# Patient Record
Sex: Male | Born: 1985 | Race: White | Hispanic: No | Smoking: Never smoker
Health system: Southern US, Community
[De-identification: ages and names within clinical notes are randomized; demographics above are authoritative.]

## PROBLEM LIST (undated history)

## (undated) DIAGNOSIS — K219 Gastro-esophageal reflux disease without esophagitis: Secondary | ICD-10-CM

## (undated) DIAGNOSIS — I10 Essential (primary) hypertension: Secondary | ICD-10-CM

---

## 2013-11-26 ENCOUNTER — Emergency Department (HOSPITAL_COMMUNITY)
Admission: EM | Admit: 2013-11-26 | Discharge: 2013-11-26 | Disposition: A | Discharge status: Home / Self Care | Payer: Medicaid Other | Attending: Emergency Medicine | Admitting: Emergency Medicine

## 2013-11-26 ENCOUNTER — Emergency Department (HOSPITAL_COMMUNITY): Payer: Medicaid Other

## 2013-11-26 ENCOUNTER — Encounter (HOSPITAL_COMMUNITY): Payer: Self-pay | Admitting: Emergency Medicine

## 2013-11-26 DIAGNOSIS — J069 Acute upper respiratory infection, unspecified: Secondary | ICD-10-CM

## 2013-11-26 DIAGNOSIS — R05 Cough: Secondary | ICD-10-CM | POA: Diagnosis present

## 2013-11-26 MED ORDER — HYDROCODONE-ACETAMINOPHEN 7.5-325 MG/15ML PO SOLN: 15.0000 mL | Freq: Every evening | ORAL | Status: AC | PRN

## 2013-11-26 MED ORDER — BENZONATATE 100 MG PO CAPS: 100.0000 mg | ORAL_CAPSULE | Freq: Three times a day (TID) | ORAL | Status: AC

## 2013-11-26 MED ORDER — HYDROCOD POLST-CHLORPHEN POLST 10-8 MG/5ML PO LQCR
5.0000 mL | Freq: Once | ORAL | Status: AC
  Administered 2013-11-26: 5 mL via ORAL
  Filled 2013-11-26: qty 5

## 2013-11-26 MED ORDER — DM-GUAIFENESIN ER 30-600 MG PO TB12: 1.0000 | ORAL_TABLET | Freq: Two times a day (BID) | ORAL | Status: AC

## 2013-11-26 MED ORDER — DEXAMETHASONE 6 MG PO TABS
6.0000 mg | ORAL_TABLET | Freq: Once | ORAL | Status: AC
  Administered 2013-11-26: 6 mg via ORAL
  Filled 2013-11-26: qty 1

## 2013-11-26 NOTE — Discharge Instructions (Signed)
Call for a follow up appointment with a Family or Primary Care Provider.  Return if Symptoms worsen.   Take medication as prescribed.  Drink plenty of fluids. Salt water gargle 3-4 times a day.

## 2013-11-26 NOTE — ED Notes (Signed)
Pt alert and oriented x4. Respirations even and unlabored, bilateral symmetrical rise and fall of chest. Skin warm and dry. In no acute distress. Denies needs.   

## 2013-11-26 NOTE — ED Notes (Signed)
Pt back from x-ray.

## 2013-11-26 NOTE — ED Notes (Signed)
Pt escorted to discharge window. Pt verbalized understanding discharge instructions. In no acute distress.  

## 2013-11-26 NOTE — ED Notes (Addendum)
Pt c/o cough, sore throat, and congestion x 2 days.  Pt reports intermittent central chest pain, only w/ cough.  Sts he did not take anything for symptoms.  Pt presents w/ two family members exhibiting same symptoms.

## 2013-11-26 NOTE — ED Notes (Signed)
Pt to xray

## 2013-11-26 NOTE — ED Provider Notes (Signed)
Medical screening examination/treatment/procedure(s) were performed by non-physician practitioner and as supervising physician I was immediately available for consultation/collaboration.   EKG Interpretation None        Layla MawKristen N Ward, DO 11/26/13 [phone]

## 2013-11-26 NOTE — ED Provider Notes (Signed)
CSN: [id number]     Arrival date & time 11/26/13  [id number] History   First MD Initiated Contact with Patient 11/26/13 0740     Chief Complaint  Patient presents with  . Cough  . Sore Throat     (Consider location/radiation/quality/duration/timing/severity/associated sxs/prior Treatment) HPI Comments: Patient is a 28 year old male presents emergency room chief complaint of cough for 2 days. The patient reports initial onset of sore throat, self resolved. He reports a productive cough. Temperature 98 to 99. No treatment prior to arrival. Reports multiple family members with similar symptoms. No PCP  Patient is a 28 y.o. male presenting with cough and pharyngitis. The history is provided by the patient. No language interpreter was used.  Cough Associated symptoms: sore throat   Associated symptoms: no chills and no fever   Sore Throat Associated symptoms include coughing and a sore throat. Pertinent negatives include no chills or fever.    History reviewed. No pertinent past medical history. History reviewed. No pertinent past surgical history. History reviewed. No pertinent family history. History  Substance Use Topics  . Smoking status: Never Smoker   . Smokeless tobacco: Not on file  . Alcohol Use: No    Review of Systems  Constitutional: Negative for fever and chills.  HENT: Positive for sore throat.   Respiratory: Positive for cough.       Allergies  Review of patient's allergies indicates not on file.  Home Medications   Prior to Admission medications   Not on File   BP 153/76  Pulse 92  Temp(Src) 98.4 F (36.9 C) (Oral)  Resp 16  SpO2 98% Physical Exam  Nursing note and vitals reviewed. Constitutional: He is oriented to person, place, and time. He appears well-developed and well-nourished.  Non-toxic appearance. He does not have a sickly appearance. He does not appear ill. No distress.  HENT:  Head: Normocephalic and atraumatic.  Mouth/Throat: Uvula is  midline. Mucous membranes are not dry. No trismus in the jaw. Posterior oropharyngeal erythema present. No oropharyngeal exudate or tonsillar abscesses.  Eyes: EOM are normal. Pupils are equal, round, and reactive to light.  Neck: Neck supple.  Pulmonary/Chest: Effort normal. He has no wheezes. He has no rales. He exhibits no tenderness.  Lymphadenopathy:       Head (right side): No tonsillar and no occipital adenopathy present.       Head (left side): No tonsillar and no occipital adenopathy present.       Right cervical: No superficial cervical adenopathy present.      Left cervical: No superficial cervical adenopathy present.  Neurological: He is alert and oriented to person, place, and time.  Skin: Skin is warm and dry. He is not diaphoretic.  Psychiatric: He has a normal mood and affect. His behavior is normal.    ED Course  Procedures (including critical care time) Labs Review Labs Reviewed - No data to display  Imaging Review Dg Chest 2 View  11/26/2013   CLINICAL DATA:  Sore throat and cough.  Nonsmoker.  EXAM: CHEST  2 VIEW  COMPARISON:  None.  FINDINGS: The heart size and mediastinal contours are within normal limits. Both lungs are clear. The visualized skeletal structures are unremarkable.  IMPRESSION: No active cardiopulmonary disease.   Electronically Signed   By: Richarda OverlieAdam  Henn M.D.   On: 11/26/2013 08:37     EKG Interpretation None      MDM   Final diagnoses:  URI (upper respiratory infection)   Pt CXR negative  for acute infiltrate. Patients symptoms are consistent with URI, likely viral etiology. Discussed that antibiotics are not indicated for viral infections. Pt will be discharged with symptomatic treatment.  Verbalizes understanding and is agreeable with plan. Pt is hemodynamically stable & in NAD prior to dc.  Meds given in ED:  Medications  chlorpheniramine-HYDROcodone (TUSSIONEX) 10-8 MG/5ML suspension 5 mL (5 mLs Oral Given 11/26/13 0818)  dexamethasone  (DECADRON) tablet 6 mg (6 mg Oral Given 11/26/13 0824)    Discharge Medication List as of 11/26/2013  8:44 AM    START taking these medications   Details  benzonatate (TESSALON) 100 MG capsule Take 1 capsule (100 mg total) by mouth every 8 (eight) hours., Starting 11/26/2013, Until Discontinued, Print    dextromethorphan-guaiFENesin (MUCINEX DM) 30-600 MG per 12 hr tablet Take 1 tablet by mouth 2 (two) times daily., Starting 11/26/2013, Until Discontinued, Print    HYDROcodone-acetaminophen (HYCET) 7.5-325 mg/15 ml solution Take 15 mLs by mouth at bedtime as needed for moderate pain or severe pain., Starting 11/26/2013, Until Discontinued, Print           Mellody DrownLauren Jebidiah Baggerly, PA-C 11/26/13 1246

## 2015-04-08 ENCOUNTER — Encounter: Payer: Self-pay | Admitting: Emergency Medicine

## 2015-04-08 ENCOUNTER — Emergency Department: Payer: Medicaid Other

## 2015-04-08 ENCOUNTER — Emergency Department
Admission: EM | Admit: 2015-04-08 | Discharge: 2015-04-08 | Disposition: A | Discharge status: Home / Self Care | Payer: Medicaid Other | Attending: Emergency Medicine | Admitting: Emergency Medicine

## 2015-04-08 DIAGNOSIS — T148XXA Other injury of unspecified body region, initial encounter: Secondary | ICD-10-CM

## 2015-04-08 DIAGNOSIS — Y9389 Activity, other specified: Secondary | ICD-10-CM | POA: Diagnosis not present

## 2015-04-08 DIAGNOSIS — Z79899 Other long term (current) drug therapy: Secondary | ICD-10-CM | POA: Diagnosis not present

## 2015-04-08 DIAGNOSIS — X58XXXA Exposure to other specified factors, initial encounter: Secondary | ICD-10-CM | POA: Insufficient documentation

## 2015-04-08 DIAGNOSIS — Y9289 Other specified places as the place of occurrence of the external cause: Secondary | ICD-10-CM | POA: Diagnosis not present

## 2015-04-08 DIAGNOSIS — R0789 Other chest pain: Secondary | ICD-10-CM

## 2015-04-08 DIAGNOSIS — K219 Gastro-esophageal reflux disease without esophagitis: Secondary | ICD-10-CM | POA: Insufficient documentation

## 2015-04-08 DIAGNOSIS — S299XXA Unspecified injury of thorax, initial encounter: Secondary | ICD-10-CM | POA: Diagnosis present

## 2015-04-08 DIAGNOSIS — I10 Essential (primary) hypertension: Secondary | ICD-10-CM | POA: Diagnosis not present

## 2015-04-08 DIAGNOSIS — Y998 Other external cause status: Secondary | ICD-10-CM | POA: Insufficient documentation

## 2015-04-08 DIAGNOSIS — S4992XA Unspecified injury of left shoulder and upper arm, initial encounter: Secondary | ICD-10-CM | POA: Insufficient documentation

## 2015-04-08 DIAGNOSIS — Z880 Allergy status to penicillin: Secondary | ICD-10-CM | POA: Insufficient documentation

## 2015-04-08 DIAGNOSIS — T148 Other injury of unspecified body region: Secondary | ICD-10-CM | POA: Diagnosis not present

## 2015-04-08 HISTORY — DX: Essential (primary) hypertension: I10

## 2015-04-08 LAB — BASIC METABOLIC PANEL
ANION GAP: 9 (ref 5–15)
BUN: 10 mg/dL (ref 6–20)
CALCIUM: 9.3 mg/dL (ref 8.9–10.3)
CO2: 28 mmol/L (ref 22–32)
Chloride: 105 mmol/L (ref 101–111)
Creatinine, Ser: 0.97 mg/dL (ref 0.61–1.24)
GFR calc non Af Amer: >60 mL/min (ref >60)
GLUCOSE: 123 mg/dL — AB (ref 65–99)
Potassium: 3.8 mmol/L (ref 3.5–5.1)
Sodium: 142 mmol/L (ref 135–145)

## 2015-04-08 LAB — CBC
HCT: 39.6 % — ABNORMAL LOW (ref 40.0–52.0)
HEMOGLOBIN: 13.3 g/dL (ref 13.0–18.0)
MCH: 27 pg (ref 26.0–34.0)
MCHC: 33.6 g/dL (ref 32.0–36.0)
MCV: 80.5 fL (ref 80.0–100.0)
PLATELETS: 251 10*3/uL (ref 150–440)
RBC: 4.93 MIL/uL (ref 4.40–5.90)
RDW: 14.7 % — ABNORMAL HIGH (ref 11.5–14.5)
WBC: 8.6 10*3/uL (ref 3.8–10.6)

## 2015-04-08 LAB — TROPONIN I

## 2015-04-08 NOTE — ED Notes (Signed)
Patient reports that he has had some intermittent left side chest pain times 2 days. Patient reports that at times it radiates to his back.

## 2015-04-08 NOTE — ED Notes (Signed)
Pt alert and oriented X4, active, cooperative, pt in NAD. RR even and unlabored, color WNL.  Pt informed to return if any life threatening symptoms occur.   

## 2015-04-08 NOTE — ED Notes (Signed)
MD at bedside. 

## 2015-04-08 NOTE — ED Provider Notes (Signed)
[facility] Emergency Department Provider Note  ____________________________________________  Time seen: Approximately 4:38 AM  I have reviewed the triage vital signs and the nursing notes.   HISTORY  Chief Complaint Chest Pain    HPI Chad Stark is a 30 y.o. male who denies any past medical history and presents tonight for evaluation of left-sided chest and shoulder pain.  Initially told triage that has been going on for 2 days, but he clarified with me during our interview as well as to his nurse, Connye Burkitt, that he has been feeling these symptoms for about 2 months.  He reports that it is worse when he picks up his son and throws him in the air.  He denies any difficulty breathing.  He describes it as an aching sensation in the top part of his left side of his chest and going into his shoulder.  He also reports that at night when he lies down he frequently will have a sensation like he is vomiting in his mouth but no vomiting comes out.  It causes a burning sensation in the back of his throat.  It is worse with spicy and greasy foods.  He does not smoke tobacco and does not take medication for hypertension although his past medical history does indicate hypertension as a diagnosis but his blood pressures have all been very appropriate for [place].  He states that none of his first-degree relatives have ever had any problems with their hearts.  He denies fever/chills, shortness of breath, nausea, vomiting.  He does have some of the epigastric burning sensation described above.  Nothing makes his symptoms better and as stated above throwing his son in the air makes it worse.   Past Medical History  Diagnosis Date  . Hypertension     There are no active problems to display for this patient.   History reviewed. No pertinent past surgical history.  Current Outpatient Rx  Name  Route  Sig  Dispense  Refill  . benzonatate (TESSALON) 100 MG capsule   Oral   Take 1 capsule  (100 mg total) by mouth every 8 (eight) hours.   21 capsule   0   . dextromethorphan-guaiFENesin (MUCINEX DM) 30-600 MG per 12 hr tablet   Oral   Take 1 tablet by mouth 2 (two) times daily.   14 tablet   0   . HYDROcodone-acetaminophen (HYCET) 7.5-325 mg/15 ml solution   Oral   Take 15 mLs by mouth at bedtime as needed for moderate pain or severe pain.   120 mL   0     Allergies Penicillins  No family history on file.  Social History Social History  Substance Use Topics  . Smoking status: Never Smoker   . Smokeless tobacco: None  . Alcohol Use: No    Review of Systems Constitutional: No fever/chills Eyes: No visual changes. ENT: No sore throat. Cardiovascular: Intermittent upper left-sided chest pain and into the shoulder. Respiratory: Denies shortness of breath. Gastrointestinal: Burning epigastric pain with a sensation that something is coming up into his mouth.  No nausea, no vomiting.  No diarrhea.  No constipation. Genitourinary: Negative for dysuria. Musculoskeletal: Negative for back pain. Skin: Negative for rash. Neurological: Negative for headaches, focal weakness or numbness.  10-point ROS otherwise negative.  ____________________________________________   PHYSICAL EXAM:  VITAL SIGNS: ED Triage Vitals  Enc Vitals Group     BP 04/08/15 0155 142/82 mmHg     Pulse Rate 04/08/15 0155 88  Resp 04/08/15 0155 18     Temp 04/08/15 0155 97.8 F (36.6 C)     Temp Source 04/08/15 0155 Oral     SpO2 04/08/15 0155 96 %     Weight 04/08/15 0154 260 lb (117.935 kg)     Height 04/08/15 0154  (1.803 m)     Head Cir --      Peak Flow --      Pain Score 04/08/15 0154 5     Pain Loc --      Pain Edu? --      Excl. in GC? --     Constitutional: Alert and oriented. Well appearing and in no acute distress. Eyes: Conjunctivae are normal. PERRL. EOMI. Head: Atraumatic. Nose: No congestion/rhinnorhea. Mouth/Throat: Mucous membranes are moist.   Oropharynx non-erythematous.  Poor dentition. Neck: No stridor.   Cardiovascular: Normal rate, regular rhythm. Grossly normal heart sounds.  Good peripheral circulation.  No reproducible chest wall tenderness Respiratory: Normal respiratory effort.  No retractions. Lungs CTAB. Gastrointestinal: Soft and nontender. No distention. No abdominal bruits. No CVA tenderness. Musculoskeletal: No lower extremity tenderness nor edema.  No joint effusions.  No reproducible pain or tenderness with range of motion of left upper extremity Neurologic:  Normal speech and language. No gross focal neurologic deficits are appreciated.  Skin:  Skin is warm, dry and intact. No rash noted. Psychiatric: Mood and affect are normal. Speech and behavior are normal.  ____________________________________________   LABS (all labs ordered are listed, but only abnormal results are displayed)  Labs Reviewed  BASIC METABOLIC PANEL - Abnormal; Notable for the following:    Glucose, Bld 123 (*)    All other components within normal limits  CBC - Abnormal; Notable for the following:    HCT 39.6 (*)    RDW 14.7 (*)    All other components within normal limits  TROPONIN I   ____________________________________________  EKG  ED ECG REPORT I, Lisl Slingerland, the attending physician, personally viewed and interpreted this ECG.  Date: 04/08/2015 EKG Time: 0 1:56 Rate: 84 Rhythm: normal sinus rhythm QRS Axis: normal Intervals: normal ST/T Wave abnormalities: normal Conduction Disturbances: none Narrative Interpretation: unremarkable  ____________________________________________  RADIOLOGY   Dg Chest 2 View  04/08/2015  CLINICAL DATA:  Intermittent left-sided chest pain for 2 days. EXAM: CHEST  2 VIEW COMPARISON:  11/26/2013 FINDINGS: The cardiomediastinal contours are normal. The lungs are clear. Pulmonary vasculature is normal. No consolidation, pleural effusion, or pneumothorax. No acute osseous abnormalities  are seen. IMPRESSION: No acute pulmonary process. Electronically Signed   By: Rubye Oaks M.D.   On: 04/08/2015 02:27    ____________________________________________   PROCEDURES  Procedure(s) performed: None  Critical Care performed: No ____________________________________________   INITIAL IMPRESSION / ASSESSMENT AND PLAN / ED COURSE  Pertinent labs & imaging results that were available during my care of the patient were reviewed by me and considered in my medical decision making (see chart for details).      Pulmonary Embolism Rule-out Criteria (PERC rule)                        If YES to ANY of the following, the PERC rule is not satisfied and cannot be used to rule out PE in this patient (consider d-dimer or imaging depending on pre-test probability).                      If NO to ALL  of the following, AND the clinician's pre-test probability is <15%, the [facility] rule is satisfied and there is no need for further workup (including no need to obtain a d-dimer) as the post-test probability of pulmonary embolism is <2%.                      Mnemonic is HAD CLOTS   H - hormone use (exogenous estrogen)      No. A - age > 50                                                 No. D - DVT/PE history                                      No.   C - coughing blood (hemoptysis)                 No. L - leg swelling, unilateral                             No. O - O2 Sat on Room Air < 95%                  No. T - tachycardia (HR ? 100)                         No. S - surgery or trauma, recent                      No.   Based on my evaluation of the patient, including application of this decision instrument, further testing to evaluate for pulmonary embolism is not indicated at this time. I have discussed this recommendation with the patient who states understanding and agreement with this plan.  HEART score is at most 1 (if truly has a history of HTN, otherwise score is zero).  Strongly  suspect musculoskeletal chest wall pain.  Vital signs are all within normal limits and patient is in no acute distress with a long-term (at least 2 month) issue.  His epigastric symptoms sound most consistent with acid reflux and I discussed that with him and encouraged him to try a PPI.  I also encouraged the use of Tylenol and/or ibuprofen (particularly if he is going to be starting a PPI anyway) and close outpatient follow-up.  I gave my usual and customary return precautions.     ____________________________________________  FINAL CLINICAL IMPRESSION(S) / ED DIAGNOSES  Final diagnoses:  Atypical chest pain  Muscle strain  Gastroesophageal reflux disease, esophagitis presence not specified      NEW MEDICATIONS STARTED DURING THIS VISIT:  Discharge Medication List as of 04/08/2015  4:50 AM        Please note that this document was prepared using voice recognition software and may include unintentional dictation errors.   Loleta Rose, MD 04/08/15 [phone]

## 2015-04-08 NOTE — Discharge Instructions (Signed)
You have been seen in the Emergency Department (ED) today for chest pain.  As we have discussed todays test results are normal, and we believe your pain is due to pain/strain and/or inflammation of the muscles and/or cartilage of your chest wall.  We recommend you take Tylenol and ibuprofen according to the label instructions.  Read through the included information for additional treatment recommendations and precautions.  Continue to take any regular medications.   Additionally, you may want to try taking an over-the-counter antacid such as Prilosec, Prevacid, or Nexium to help with your acid reflux symptoms.  Return to the Emergency Department (ED) if you experience any further chest pain/pressure/tightness, difficulty breathing, or sudden sweating, or other symptoms that concern you.   Nonspecific Chest Pain It is often hard to find the cause of chest pain. There is always a chance that your pain could be related to something serious, such as a heart attack or a blood clot in your lungs. Chest pain can also be caused by conditions that are not life-threatening. If you have chest pain, it is very important to follow up with your doctor.  HOME CARE  If you were prescribed an antibiotic medicine, finish it all even if you start to feel better.  Avoid any activities that cause chest pain.  Do not use any tobacco products, including cigarettes, chewing tobacco, or electronic cigarettes. If you need help quitting, ask your doctor.  Do not drink alcohol.  Take medicines only as told by your doctor.  Keep all follow-up visits as told by your doctor. This is important. This includes any further testing if your chest pain does not go away.  Your doctor may tell you to keep your head raised (elevated) while you sleep.  Make lifestyle changes as told by your doctor. These may include:  Getting regular exercise. Ask your doctor to suggest some activities that are safe for you.  Eating a  heart-healthy diet. Your doctor or a diet specialist (dietitian) can help you to learn healthy eating options.  Maintaining a healthy weight.  Managing diabetes, if necessary.  Reducing stress. GET HELP IF:  Your chest pain does not go away, even after treatment.  You have a rash with blisters on your chest.  You have a fever. GET HELP RIGHT AWAY IF:  Your chest pain is worse.  You have an increasing cough, or you cough up blood.  You have severe belly (abdominal) pain.  You feel extremely weak.  You pass out (faint).  You have chills.  You have sudden, unexplained chest discomfort.  You have sudden, unexplained discomfort in your arms, back, neck, or jaw.  You have shortness of breath at any time.  You suddenly start to sweat, or your skin gets clammy.  You feel nauseous.  You vomit.  You suddenly feel light-headed or dizzy.  Your heart begins to beat quickly, or it feels like it is skipping beats. These symptoms may be an emergency. Do not wait to see if the symptoms will go away. Get medical help right away. Call your local emergency services (911 in the [place] Do not drive yourself to the hospital.   This information is not intended to replace advice given to you by your health care provider. Make sure you discuss any questions you have with your health care provider.   Document Released: 07/17/2007 Document Revised: 02/18/2014 Document Reviewed: 09/03/2013 Elsevier Interactive Patient Education 2016 [employer]  Heartburn Heartburn is a type of pain or discomfort  that can happen in the throat or chest. It is often described as a burning pain. It may also cause a bad taste in the mouth. Heartburn may feel worse when you lie down or bend over. It may be caused by stomach contents that move back up (reflux) into the tube that connects the mouth with the stomach (esophagus). HOME CARE Take these actions to lessen your discomfort and to help avoid  problems. Diet  Follow a diet as told by your doctor. You may need to avoid foods and drinks such as:  Coffee and tea (with or without caffeine).  Drinks that contain alcohol.  Energy drinks and sports drinks.  Carbonated drinks or sodas.  Chocolate and cocoa.  Peppermint and mint flavorings.  Garlic and onions.  Horseradish.  Spicy and acidic foods, such as peppers, chili powder, curry powder, vinegar, hot sauces, and BBQ sauce.  Citrus fruit juices and citrus fruits, such as oranges, lemons, and limes.  Tomato-based foods, such as red sauce, chili, salsa, and pizza with red sauce.  Fried and fatty foods, such as donuts, french fries, potato chips, and high-fat dressings.  High-fat meats, such as hot dogs, rib eye steak, sausage, ham, and bacon.  High-fat dairy items, such as whole milk, butter, and cream cheese.  Eat small meals often. Avoid eating large meals.  Avoid drinking large amounts of liquid with your meals.  Avoid eating meals during the 2-3 hours before bedtime.  Avoid lying down right after you eat.  Do not exercise right after you eat. General Instructions  Pay attention to any changes in your symptoms.  Take over-the-counter and prescription medicines only as told by your doctor. Do not take aspirin, ibuprofen, or other NSAIDs unless your doctor says it is okay.  Do not use any tobacco products, including cigarettes, chewing tobacco, and e-cigarettes. If you need help quitting, ask your doctor.  Wear loose clothes. Do not wear anything tight around your waist.  Raise (elevate) the head of your bed about 6 inches (15 cm).  Try to lower your stress. If you need help doing this, ask your doctor.  If you are overweight, lose an amount of weight that is healthy for you. Ask your doctor about a safe weight loss goal.  Keep all follow-up visits as told by your doctor. This is important. GET HELP IF:  You have new symptoms.  You lose weight and  you do not know why it is happening.  You have trouble swallowing, or it hurts to swallow.  You have wheezing or a cough that keeps happening.  Your symptoms do not get better with treatment.  You have heartburn often for more than two weeks. GET HELP RIGHT AWAY IF:  You have pain in your arms, neck, jaw, teeth, or back.  You feel sweaty, dizzy, or light-headed.  You have chest pain or shortness of breath.  You throw up (vomit) and your throw up looks like blood or coffee grounds.  Your poop (stool) is bloody or black.   This information is not intended to replace advice given to you by your health care provider. Make sure you discuss any questions you have with your health care provider.   Document Released: 10/10/2010 Document Revised: 10/19/2014 Document Reviewed: 05/25/2014 Elsevier Interactive Patient Education [employer]

## 2015-06-08 ENCOUNTER — Encounter: Payer: Self-pay | Admitting: Emergency Medicine

## 2015-06-08 ENCOUNTER — Emergency Department
Admission: EM | Admit: 2015-06-08 | Discharge: 2015-06-08 | Disposition: A | Discharge status: Home / Self Care | Payer: Medicaid Other | Attending: Emergency Medicine | Admitting: Emergency Medicine

## 2015-06-08 DIAGNOSIS — I10 Essential (primary) hypertension: Secondary | ICD-10-CM | POA: Insufficient documentation

## 2015-06-08 DIAGNOSIS — K219 Gastro-esophageal reflux disease without esophagitis: Secondary | ICD-10-CM | POA: Insufficient documentation

## 2015-06-08 HISTORY — DX: Gastro-esophageal reflux disease without esophagitis: K21.9

## 2015-06-08 MED ORDER — PANTOPRAZOLE SODIUM 40 MG PO TBEC
40.0000 mg | DELAYED_RELEASE_TABLET | Freq: Once | ORAL | Status: AC
  Administered 2015-06-08: 40 mg via ORAL
  Filled 2015-06-08: qty 1

## 2015-06-08 MED ORDER — PANTOPRAZOLE SODIUM 40 MG PO TBEC: 40.0000 mg | DELAYED_RELEASE_TABLET | Freq: Every day | ORAL | Status: AC

## 2015-06-08 MED ORDER — GI COCKTAIL ~~LOC~~
30.0000 mL | Freq: Once | ORAL | Status: AC
  Administered 2015-06-08: 30 mL via ORAL
  Filled 2015-06-08: qty 30

## 2015-06-08 NOTE — Discharge Instructions (Signed)

## 2015-06-08 NOTE — ED Notes (Signed)
Pt st "it's been happening for awhile, they told me it was GERD, at times it feels hard to swallow, it comes back up in my throat"; st told to take prilosec but not doing so; denies any other c/o

## 2015-06-08 NOTE — ED Provider Notes (Signed)
[facility] Emergency Department Provider Note  ____________________________________________  Time seen: 3:30 AM  I have reviewed the triage vital signs and the nursing notes.   HISTORY  Chief Complaint Gastroesophageal Reflux     HPI Chad Stark is a 30 y.o. male presents with burning sensation in his throat/chest, patient states he occasionally has a sick taste in the back of his throat. Patient states that he was diagnosed with GERD in the past and advised to take Prilosec however he did not do so. Patient denies any chest pain at this time no shortness of breath     Past Medical History  Diagnosis Date  . Hypertension   . GERD (gastroesophageal reflux disease)     There are no active problems to display for this patient.   History reviewed. No pertinent past surgical history.  Current Outpatient Rx  Name  Route  Sig  Dispense  Refill  . benzonatate (TESSALON) 100 MG capsule   Oral   Take 1 capsule (100 mg total) by mouth every 8 (eight) hours.   21 capsule   0   . dextromethorphan-guaiFENesin (MUCINEX DM) 30-600 MG per 12 hr tablet   Oral   Take 1 tablet by mouth 2 (two) times daily.   14 tablet   0   . HYDROcodone-acetaminophen (HYCET) 7.5-325 mg/15 ml solution   Oral   Take 15 mLs by mouth at bedtime as needed for moderate pain or severe pain.   120 mL   0   . pantoprazole (PROTONIX) 40 MG tablet   Oral   Take 1 tablet (40 mg total) by mouth daily.   30 tablet   0     Allergies Penicillins  No family history on file.  Social History Social History  Substance Use Topics  . Smoking status: Never Smoker   . Smokeless tobacco: None  . Alcohol Use: No    Review of Systems  Constitutional: Negative for fever. Eyes: Negative for visual changes. ENT: Negative for sore throat.Positive for burning throat/chest Cardiovascular: Negative for chest pain. Respiratory: Negative for shortness of breath. Gastrointestinal:  Negative for abdominal pain, vomiting and diarrhea. Genitourinary: Negative for dysuria. Musculoskeletal: Negative for back pain. Skin: Negative for rash. Neurological: Negative for headaches, focal weakness or numbness.   10-point ROS otherwise negative.  ____________________________________________   PHYSICAL EXAM:  VITAL SIGNS: ED Triage Vitals  Enc Vitals Group     BP 06/08/15 0203 152/80 mmHg     Pulse Rate 06/08/15 0203 90     Resp 06/08/15 0203 20     Temp 06/08/15 0203 98.1 F (36.7 C)     Temp Source 06/08/15 0203 Oral     SpO2 06/08/15 0203 99 %     Weight --      Height 06/08/15 0203 5\' 11"  (1.803 m)     Head Cir --      Peak Flow --      Pain Score --      Pain Loc --      Pain Edu? --      Excl. in GC? --      Constitutional: Alert and oriented. Well appearing and in no distress. Eyes: Conjunctivae are normal. PERRL. Normal extraocular movements. ENT   Head: Normocephalic and atraumatic.   Nose: No congestion/rhinnorhea.   Mouth/Throat: Mucous membranes are moist.   Neck: No stridor. Hematological/Lymphatic/Immunilogical: No cervical lymphadenopathy. Cardiovascular: Normal rate, regular rhythm. Normal and symmetric distal pulses are present in all extremities.  No murmurs, rubs, or gallops. Respiratory: Normal respiratory effort without tachypnea nor retractions. Breath sounds are clear and equal bilaterally. No wheezes/rales/rhonchi. Gastrointestinal: Soft and nontender. No distention. There is no CVA tenderness. Genitourinary: deferred Musculoskeletal: Nontender with normal range of motion in all extremities. No joint effusions.  No lower extremity tenderness nor edema. Neurologic:  Normal speech and language. No gross focal neurologic deficits are appreciated. Speech is normal.  Skin:  Skin is warm, dry and intact. No rash noted. Psychiatric: Mood and affect are normal. Speech and behavior are normal. Patient exhibits appropriate insight  and judgment.    INITIAL IMPRESSION / ASSESSMENT AND PLAN / ED COURSE  Pertinent labs & imaging results that were available during my care of the patient were reviewed by me and considered in my medical decision making (see chart for details).    ____________________________________________   FINAL CLINICAL IMPRESSION(S) / ED DIAGNOSES  Final diagnoses:  Gastroesophageal reflux disease, esophagitis presence not specified      Darci Current, MD 06/09/15 [phone]

## 2016-08-09 IMAGING — CR DG CHEST 2V
2 series · 2 of 2 positions shown · non-contrast
Comparison: None.

CLINICAL DATA: Sore throat and cough.  Nonsmoker.

EXAM:
CHEST  2 VIEW

[w chest pa]
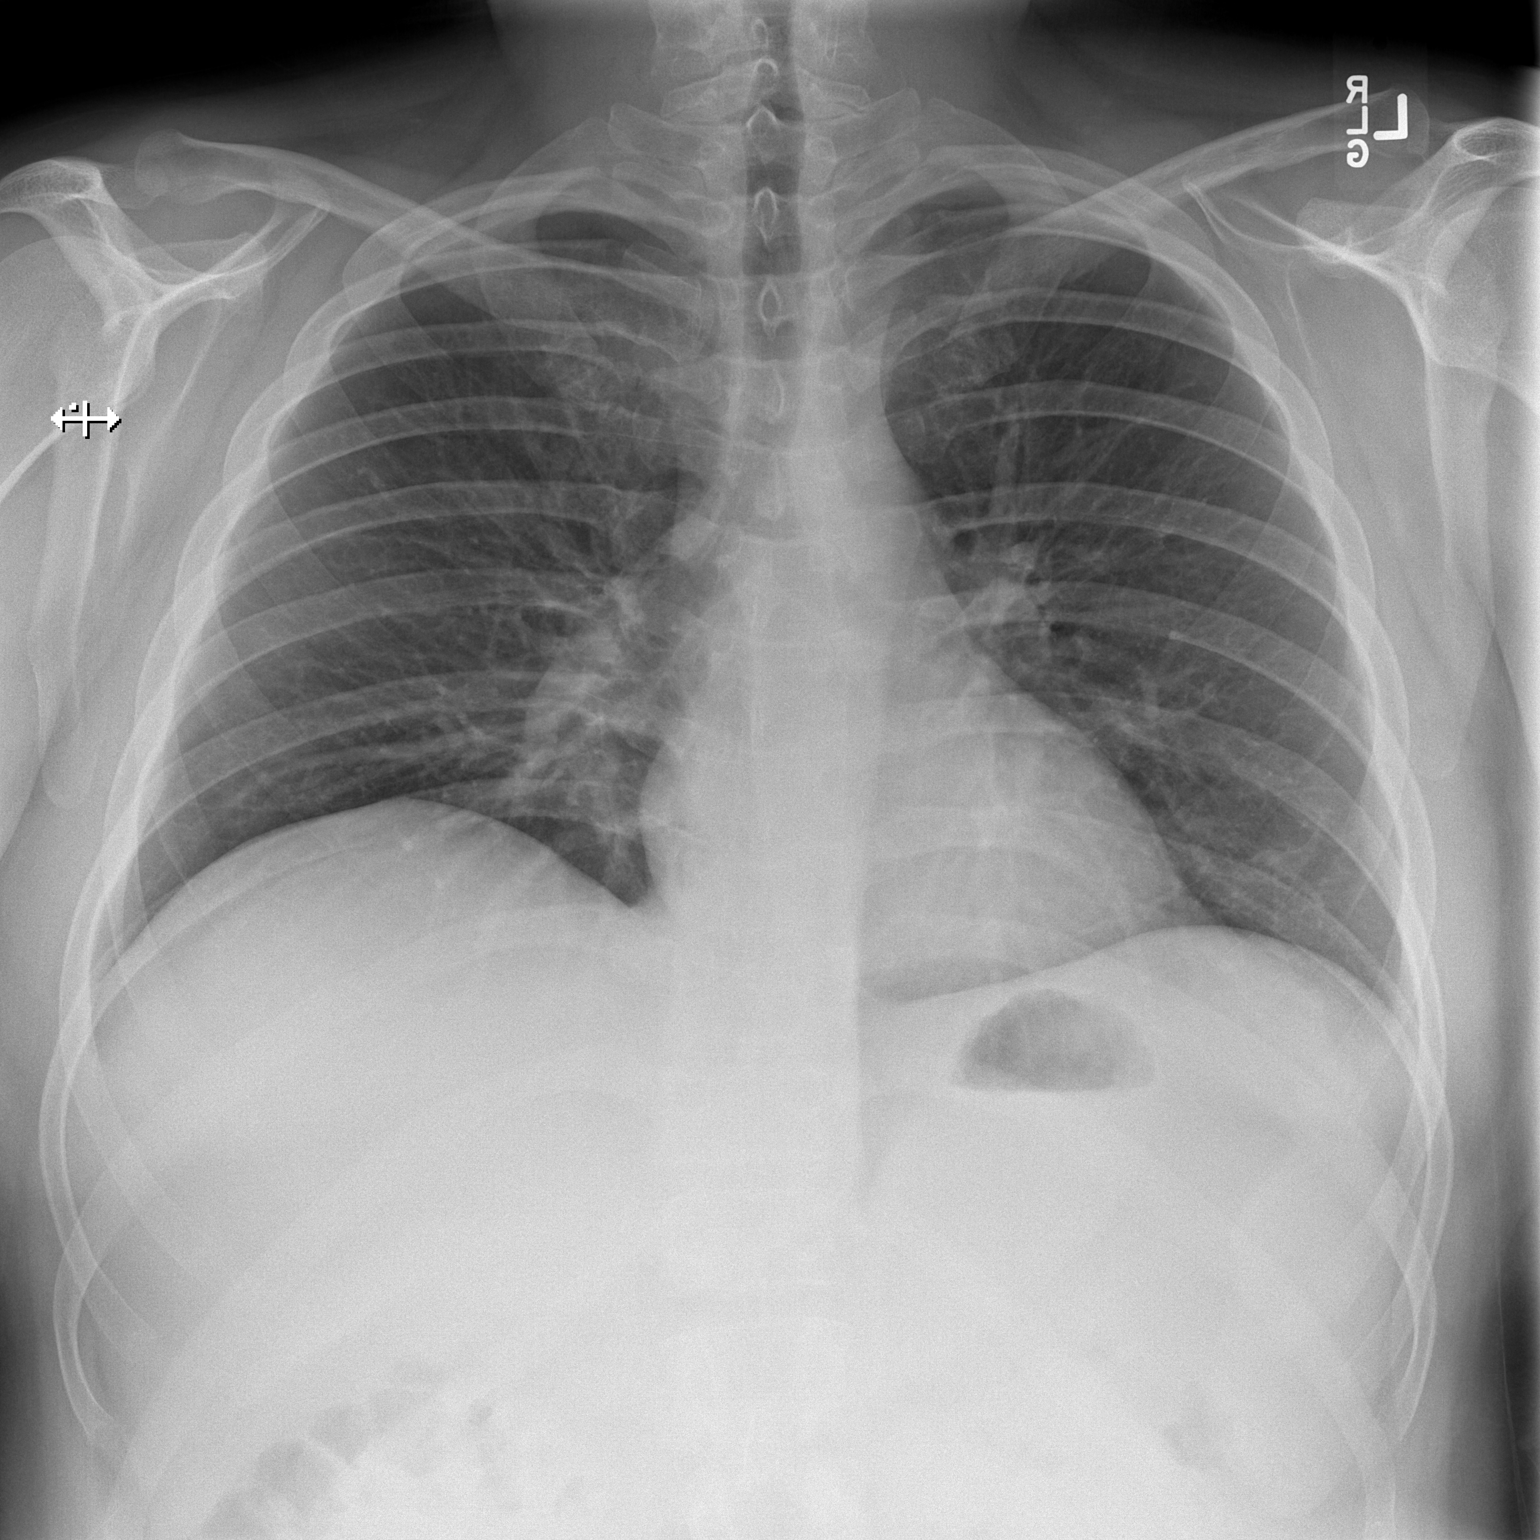

[w chest lat]
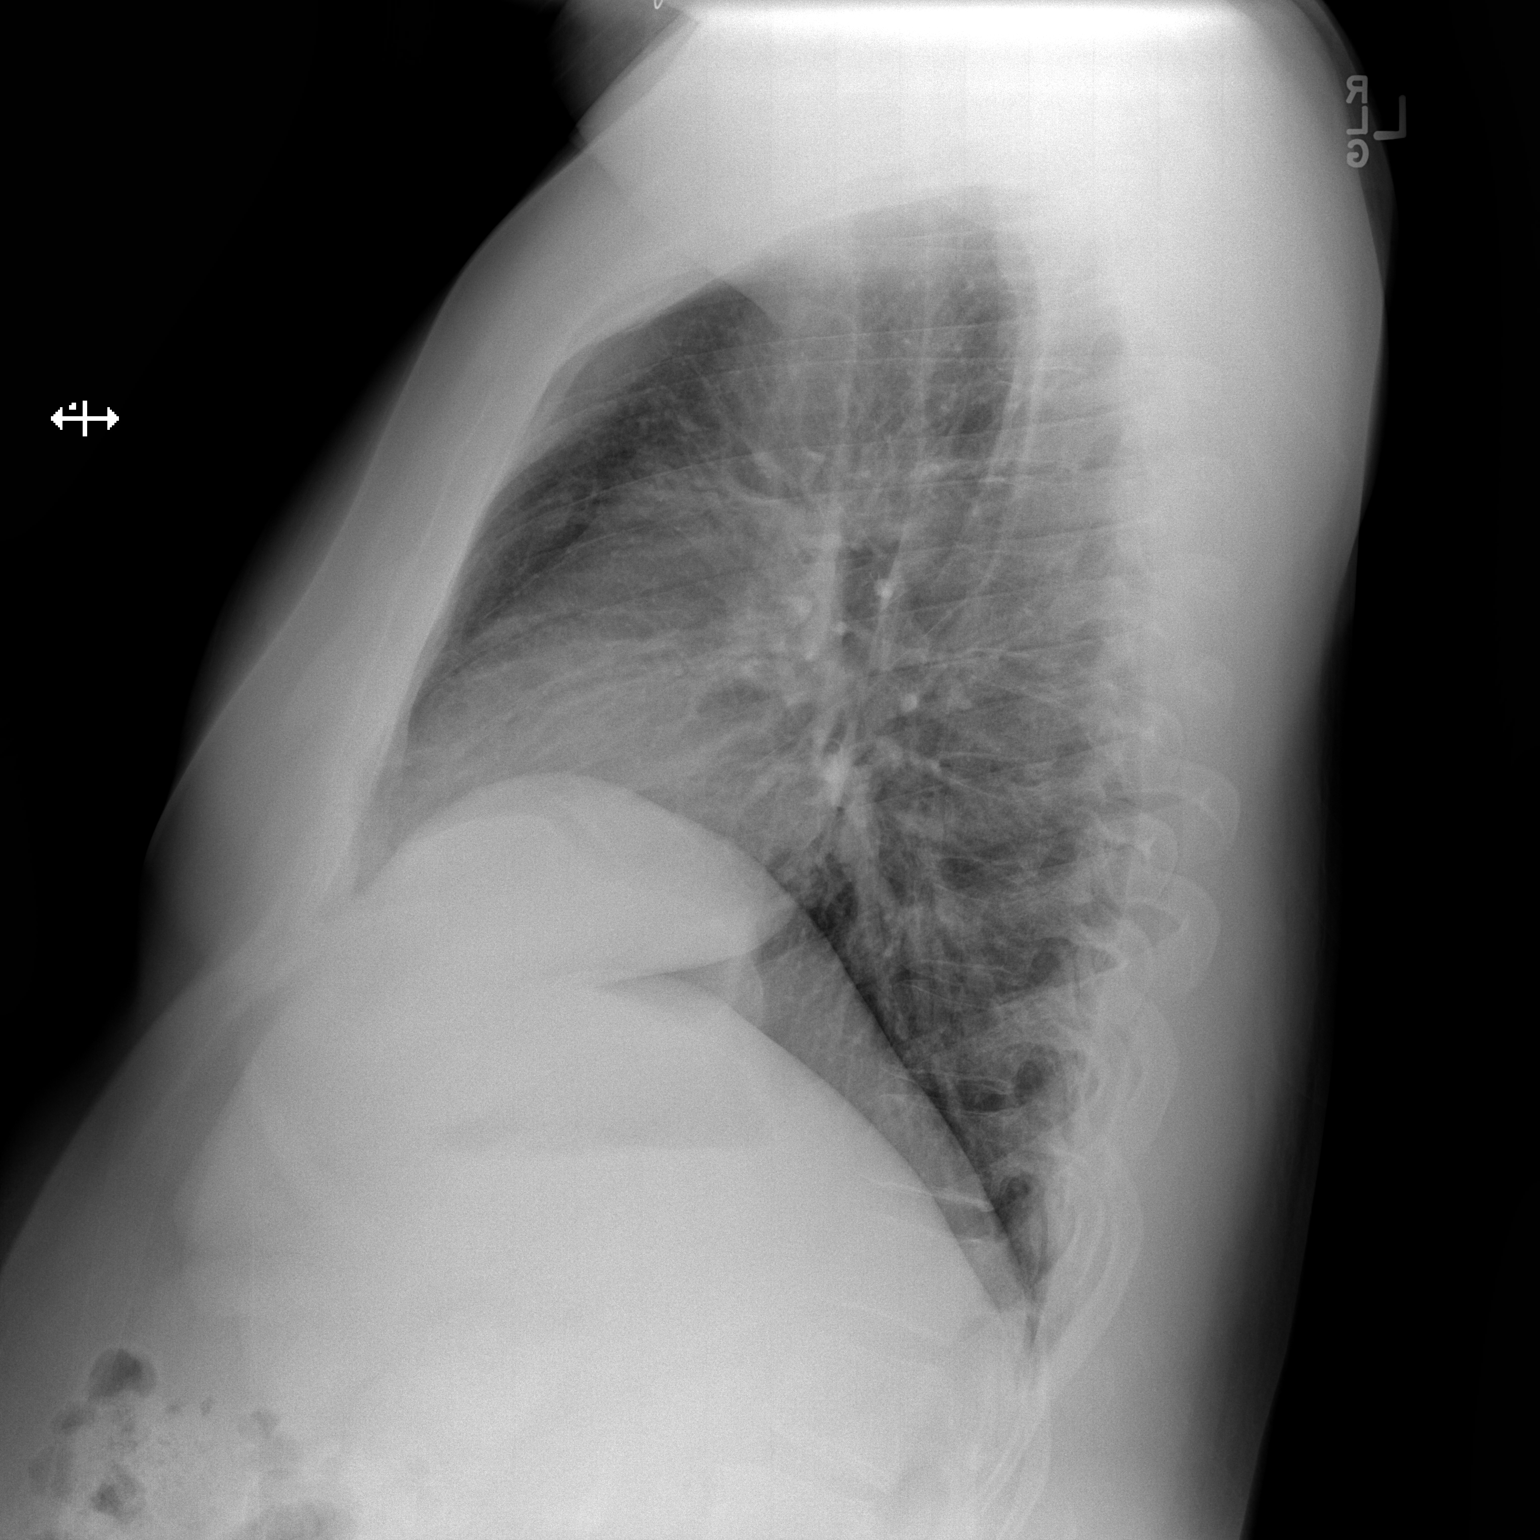

[2 of 2 positions shown; findings below may reference images not displayed]

FINDINGS: The heart size and mediastinal contours are within normal limits.
Both lungs are clear. The visualized skeletal structures are
unremarkable.
IMPRESSION: No active cardiopulmonary disease.

## 2017-12-20 IMAGING — CR DG CHEST 2V
2 series · 2 of 2 positions shown · non-contrast
Comparison: 11/26/2013

CLINICAL DATA: Intermittent left-sided chest pain for 2 days.

EXAM:
CHEST  2 VIEW

[chest pa]
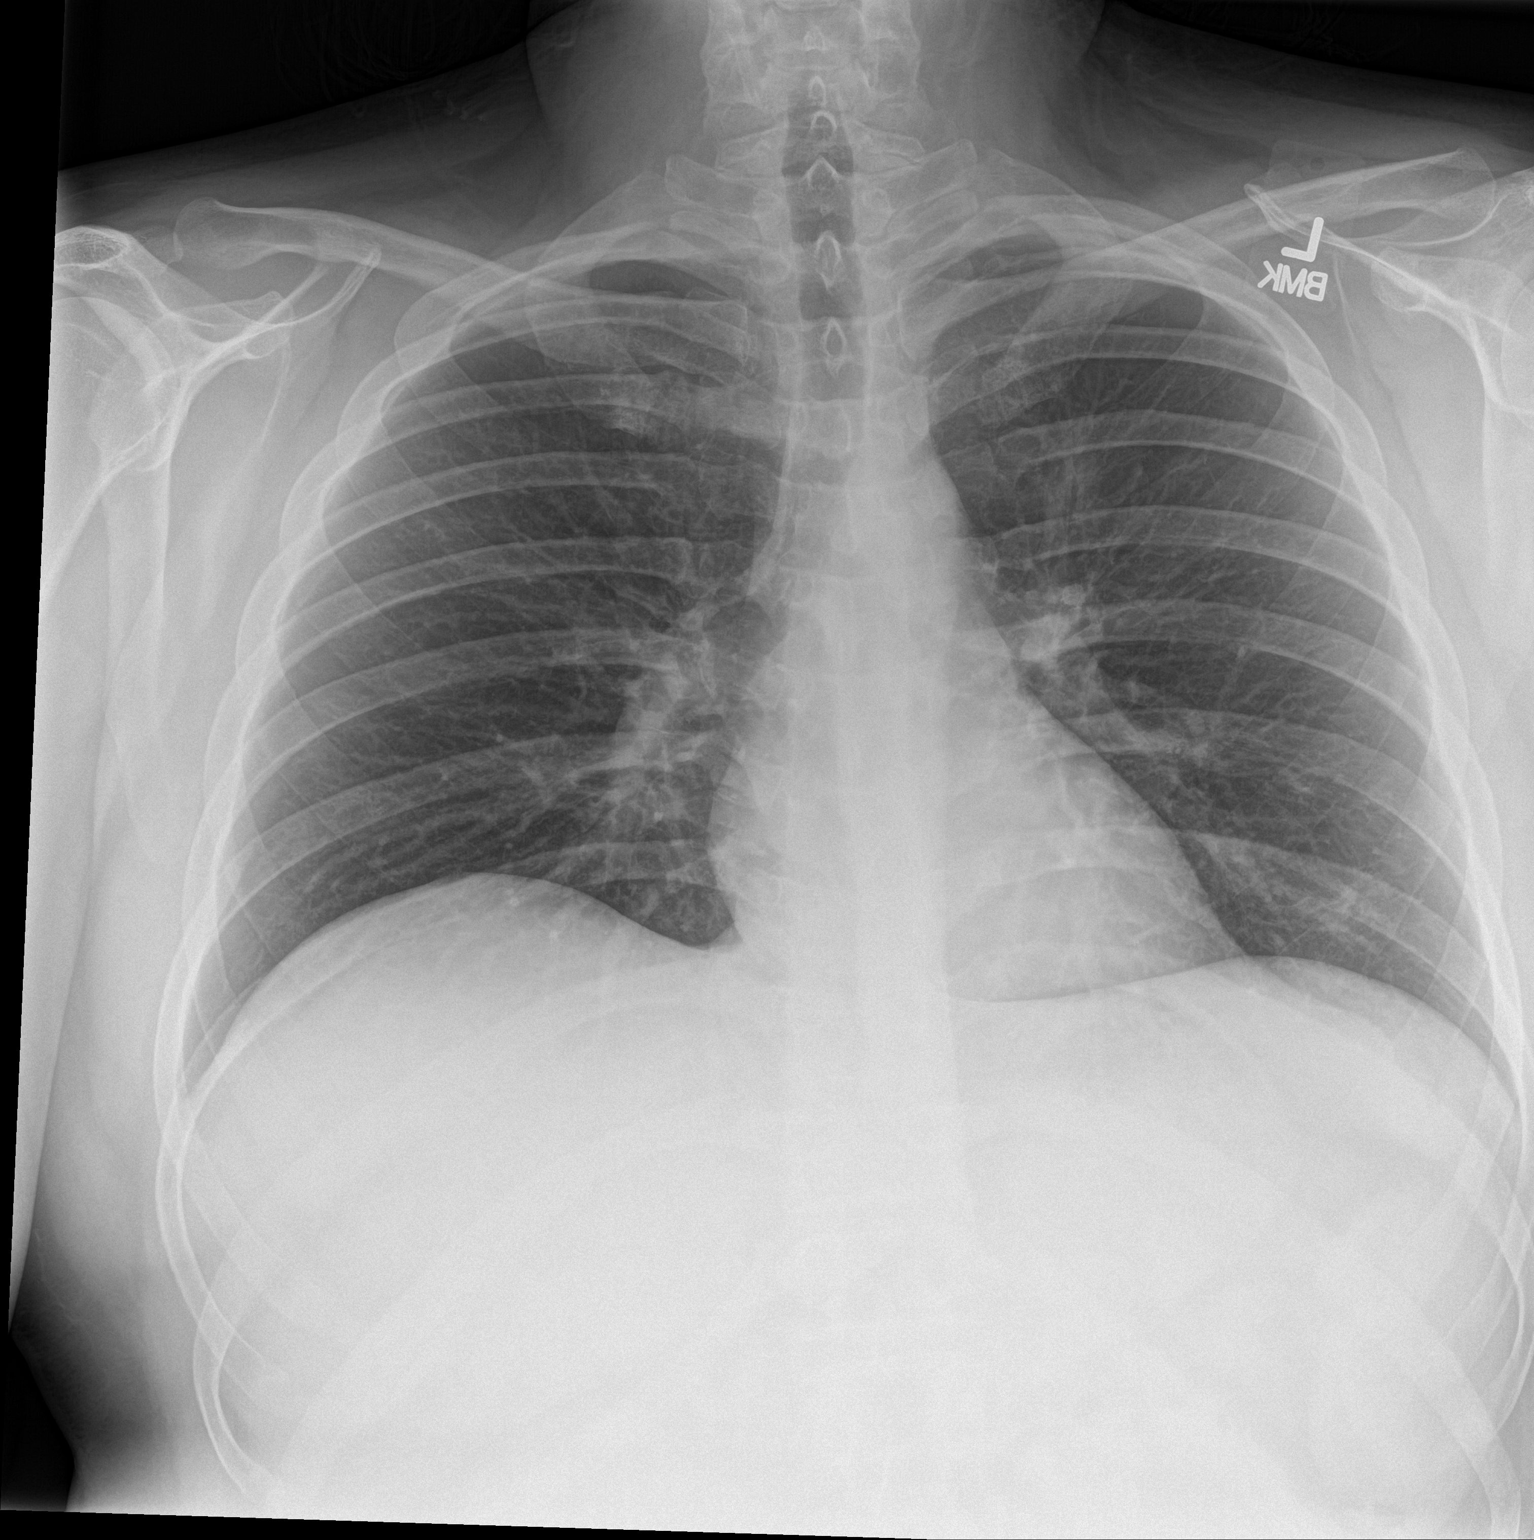

[chest lat]
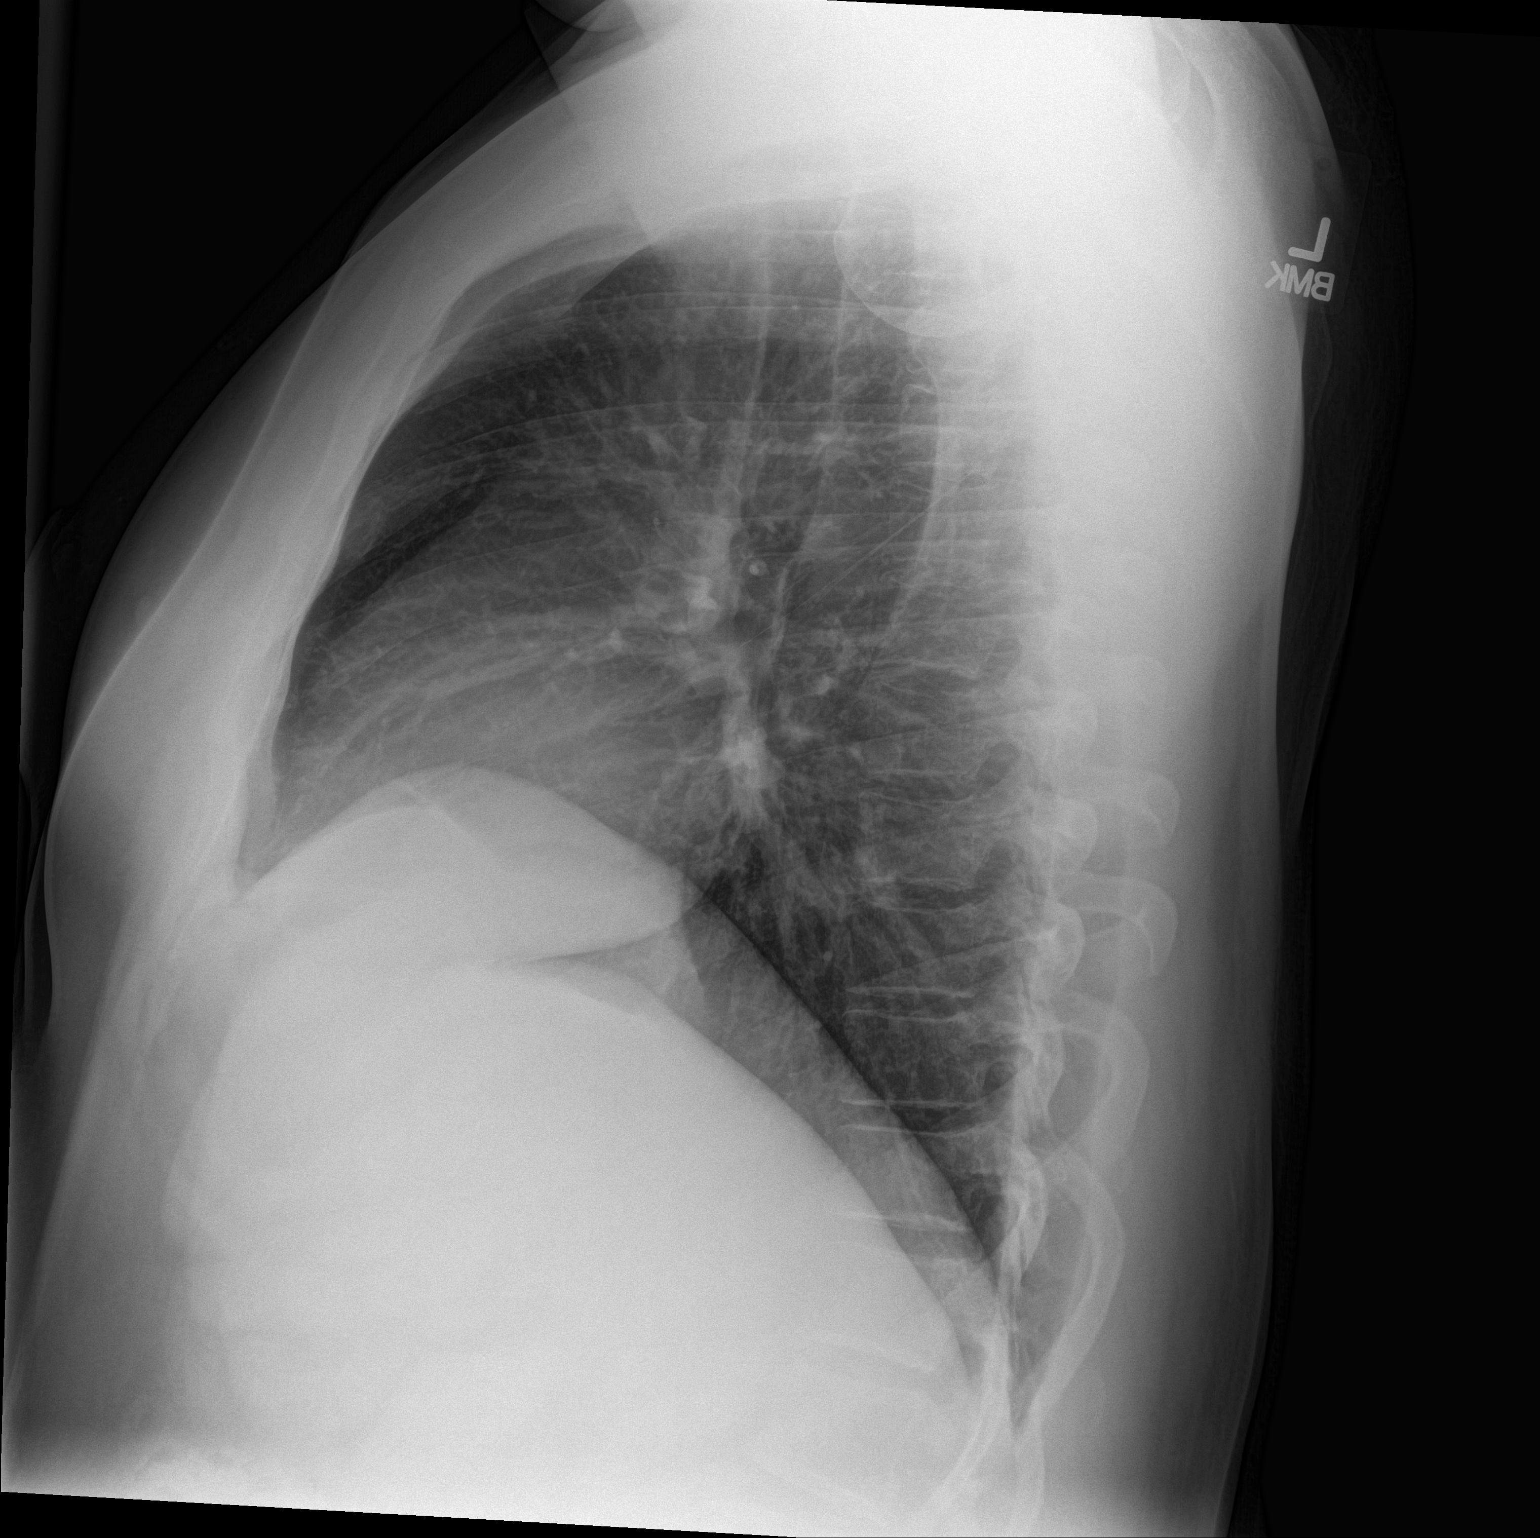

[2 of 2 positions shown; findings below may reference images not displayed]

FINDINGS: The cardiomediastinal contours are normal. The lungs are clear.
Pulmonary vasculature is normal. No consolidation, pleural effusion,
or pneumothorax. No acute osseous abnormalities are seen.
IMPRESSION: No acute pulmonary process.
